# Patient Record
Sex: Female | Born: 2005 | Race: Black or African American | Hispanic: No | Marital: Single | Smoking: Never smoker
Health system: Southern US, Community
[De-identification: ages and names within clinical notes are randomized; demographics above are authoritative.]

## PROBLEM LIST (undated history)

## (undated) ENCOUNTER — Emergency Department (HOSPITAL_BASED_OUTPATIENT_CLINIC_OR_DEPARTMENT_OTHER): Admission: EM | Source: Home / Self Care

## (undated) DIAGNOSIS — J45909 Unspecified asthma, uncomplicated: Secondary | ICD-10-CM

---
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
## 2023-01-05 ENCOUNTER — Ambulatory Visit (INDEPENDENT_AMBULATORY_CARE_PROVIDER_SITE_OTHER): Payer: Medicaid Other

## 2023-01-05 DIAGNOSIS — J309 Allergic rhinitis, unspecified: Secondary | ICD-10-CM

## 2023-01-07 ENCOUNTER — Ambulatory Visit (INDEPENDENT_AMBULATORY_CARE_PROVIDER_SITE_OTHER): Payer: Medicaid Other

## 2023-01-07 DIAGNOSIS — J309 Allergic rhinitis, unspecified: Secondary | ICD-10-CM | POA: Diagnosis not present

## 2023-01-12 ENCOUNTER — Ambulatory Visit (INDEPENDENT_AMBULATORY_CARE_PROVIDER_SITE_OTHER): Payer: Medicaid Other

## 2023-01-12 DIAGNOSIS — J309 Allergic rhinitis, unspecified: Secondary | ICD-10-CM

## 2023-01-14 ENCOUNTER — Ambulatory Visit (INDEPENDENT_AMBULATORY_CARE_PROVIDER_SITE_OTHER): Payer: Medicaid Other

## 2023-01-14 DIAGNOSIS — J309 Allergic rhinitis, unspecified: Secondary | ICD-10-CM

## 2023-01-17 ENCOUNTER — Ambulatory Visit (INDEPENDENT_AMBULATORY_CARE_PROVIDER_SITE_OTHER): Payer: Medicaid Other

## 2023-01-17 DIAGNOSIS — J4550 Severe persistent asthma, uncomplicated: Secondary | ICD-10-CM

## 2023-01-18 NOTE — Patient Instructions (Signed)
Asthma Continue montelukast 10 mg once a day to prevent cough or wheeze Continue Breztri 2 puffs twice a day with a spacer to prevent cough or wheeze.  Continue albuterol 2 puffs every 4 hours as needed for cough or wheeze OR Instead use albuterol 0.083% solution via nebulizer one unit vial every 4 hours as needed for cough or wheeze You may use albuterol 5 to 15 minutes before activity to decrease cough or wheeze For asthma flare, add Flovent 110 2 puffs twice a day for 2 weeks or until cough and wheeze free Continue Nucala once every 4 weeks  Allergic rhinitis Continue allergen avoidance measures directed toward grass pollen, weed pollen, tree pollen, mold, dust mite, and cat as listed below Continue cetirizine 10 mg once a day as needed for runny nose or itch Continue Flonase 2 sprays in each nostril once a day as needed for stuffy nose.  In the right nostril, point the applicator out toward the right ear. In the left nostril, point the applicator out toward the left ear Consider allergy injections if the treatment plan as listed above is not managing your symptoms of allergic rhinitis  Atopic dermatitis Continue a twice a day moisturizing routine  Call the clinic if this treatment plan is not working well for you.  Follow up in the clinic in 6 months or sooner if needed  Reducing Pollen Exposure The American Academy of Allergy, Asthma and Immunology suggests the following steps to reduce your exposure to pollen during allergy seasons. Do not hang sheets or clothing out to dry; pollen may collect on these items. Do not mow lawns or spend time around freshly cut grass; mowing stirs up pollen. Keep windows closed at night.  Keep car windows closed while driving. Minimize morning activities outdoors, a time when pollen counts are usually at their highest. Stay indoors as much as possible when pollen counts or humidity is high and on windy days when pollen tends to remain in the air  longer. Use air conditioning when possible.  Many air conditioners have filters that trap the pollen spores. Use a HEPA room air filter to remove pollen form the indoor air you breathe.  Control of Mold Allergen Mold and fungi can grow on a variety of surfaces provided certain temperature and moisture conditions exist.  Outdoor molds grow on plants, decaying vegetation and soil.  The major outdoor mold, Alternaria and Cladosporium, are found in very high numbers during hot and dry conditions.  Generally, a late Summer - Fall peak is seen for common outdoor fungal spores.  Rain will temporarily lower outdoor mold spore count, but counts rise rapidly when the rainy period ends.  The most important indoor molds are Aspergillus and Penicillium.  Dark, humid and poorly ventilated basements are ideal sites for mold growth.  The next most common sites of mold growth are the bathroom and the kitchen.  Outdoor [employer] Use air conditioning and keep windows closed Avoid exposure to decaying vegetation. Avoid leaf raking. Avoid grain handling. Consider wearing a face mask if working in moldy areas.  Indoor Mold Control Maintain humidity below 50%. Clean washable surfaces with 5% bleach solution. Remove sources e.g. Contaminated carpets.   Control of Dust Mite Allergen Dust mites play a major role in allergic asthma and rhinitis. They occur in environments with high humidity wherever human skin is found. Dust mites absorb humidity from the atmosphere (ie, they do not drink) and feed on organic matter (including shed human and animal  skin). Dust mites are a microscopic type of insect that you cannot see with the naked eye. High levels of dust mites have been detected from mattresses, pillows, carpets, upholstered furniture, bed covers, clothes, soft toys and any woven material. The principal allergen of the dust mite is found in its feces. A gram of dust may contain 1,000 mites and 250,000 fecal  particles. Mite antigen is easily measured in the air during house cleaning activities. Dust mites do not bite and do not cause harm to humans, other than by triggering allergies/asthma.  Ways to decrease your exposure to dust mites in your home:  1. Encase mattresses, box springs and pillows with a mite-impermeable barrier or cover  2. Wash sheets, blankets and drapes weekly in hot water (130 F) with detergent and dry them in a dryer on the hot setting.  3. Have the room cleaned frequently with a vacuum cleaner and a damp dust-mop. For carpeting or rugs, vacuuming with a vacuum cleaner equipped with a high-efficiency particulate air (HEPA) filter. The dust mite allergic individual should not be in a room which is being cleaned and should wait 1 hour after cleaning before going into the room.  4. Do not sleep on upholstered furniture (eg, couches).  5. If possible removing carpeting, upholstered furniture and drapery from the home is ideal. Horizontal blinds should be eliminated in the rooms where the person spends the most time (bedroom, study, television room). Washable vinyl, roller-type shades are optimal.  6. Remove all non-washable stuffed toys from the bedroom. Wash stuffed toys weekly like sheets and blankets above.  7. Reduce indoor humidity to less than 50%. Inexpensive humidity monitors can be purchased at most hardware stores. Do not use a humidifier as can make the problem worse and are not recommended.  Control of Dog or Cat Allergen Avoidance is the best way to manage a dog or cat allergy. If you have a dog or cat and are allergic to dog or cats, consider removing the dog or cat from the home. If you have a dog or cat but don't want to find it a new home, or if your family wants a pet even though someone in the household is allergic, here are some strategies that may help keep symptoms at bay:  Keep the pet out of your bedroom and restrict it to only a few rooms. Be advised that  keeping the dog or cat in only one room will not limit the allergens to that room. Don't pet, hug or kiss the dog or cat; if you do, wash your hands with soap and water. High-efficiency particulate air (HEPA) cleaners run continuously in a bedroom or living room can reduce allergen levels over time. Regular use of a high-efficiency vacuum cleaner or a central vacuum can reduce allergen levels. Giving your dog or cat a bath at least once a week can reduce airborne allergen.

## 2023-01-18 NOTE — Progress Notes (Signed)
[address] [place] [address] Dept: [phone]  FOLLOW UP NOTE  Patient ID: Diana Arias, female    DOB: 14-Dec-2005  Age: 17 y.o. MRN: [id number] Date of Office Visit: 01/19/2023  Assessment  Chief Complaint: Follow-up  HPI Diana Arias is a 17 year old female who presents to the clinic for follow-up visit.  She was last seen in this clinic on 10/22/2022 by Dr. Dellis Anes for evaluation of asthma and allergic rhinitis on allergen immunotherapy.  She began allergen immunotherapy directed toward grass pollen, tree pollen, cat, dog, mold, dust mite, and 11/19/2022.  She completed rapid desensitization on 08/09/2022.  At today's visit, she reports her asthma has been moderately well-controlled but occasional shortness of breath.  She is out of her asthma medications at this time, however, she has previously taken montelukast 10 mg once a day, Breztri 2 puffs twice a day, and uses albuterol about 1-2 times a month with relief of symptoms.  She continues Nucala injections once a month with no large or local reactions.  She reports a significant decrease in her symptoms of asthma while continuing on Nucala injections.  Allergic rhinitis is reported as well-controlled with no symptoms including rhinorrhea, nasal congestion, sneezing, and postnasal drainage.  She continues Flonase daily and is not currently taking an antihistamine or using saline nasal rinses.  She continues allergen immunotherapy directed toward grass pollen, tree pollen, cat, dog, mold, and dust mite.  She denies large or local reactions.  She reports a significant decrease in her symptoms of allergic rhinitis while continuing allergen immunotherapy.  Atopic dermatitis is reported as well-controlled with no red or itchy areas.  She continues a twice a day moisturizing routine and has not needed any topical medicated treatments since her last visit to this clinic.  Her current medications are listed in the  chart.  Drug Allergies:  Allergies  Allergen Reactions   Cat Hair Extract     Physical Exam: BP 104/70   Pulse (!) 115   Temp 98 F (36.7 C)   Resp 14   Ht 5' 1.42" (1.56 m)   Wt 138 lb 4 oz (62.7 kg)   SpO2 98%   BMI 25.77 kg/m    Physical Exam Vitals reviewed.  Constitutional:      Appearance: Normal appearance.  HENT:     Head: Normocephalic and atraumatic.     Right Ear: Tympanic membrane normal.     Left Ear: Tympanic membrane normal.     Nose:     Comments: Bilateral nares edematous and pale with thin clear nasal drainage noted.  Pharynx normal.  Ears normal.  Eyes normal.    Mouth/Throat:     Pharynx: Oropharynx is clear.  Eyes:     Conjunctiva/sclera: Conjunctivae normal.  Cardiovascular:     Rate and Rhythm: Normal rate and regular rhythm.     Heart sounds: Normal heart sounds. No murmur heard. Pulmonary:     Effort: Pulmonary effort is normal.     Breath sounds: Normal breath sounds.     Comments: Lungs clear to auscultation Musculoskeletal:        General: Normal range of motion.     Cervical back: Normal range of motion and neck supple.  Skin:    General: Skin is warm and dry.  Neurological:     Mental Status: She is alert and oriented to person, place, and time.  Psychiatric:        Mood and Affect: Mood normal.  Behavior: Behavior normal.        Thought Content: Thought content normal.        Judgment: Judgment normal.     Diagnostics: FVC 2.42 which is 81% of predicted value, FEV1 1.50 which is 57% of predicted value.  Spirometry indicates moderate airway obstruction.  Assessment and Plan: 1. Severe persistent asthma without complication   2. Seasonal and perennial allergic rhinitis   3. Flexural atopic dermatitis     Meds ordered this encounter  Medications   BREZTRI AEROSPHERE 160-9-4.8 MCG/ACT AERO    Sig: Inhale 2 puffs into the lungs in the morning and at bedtime.    Dispense:  10.7 g    Refill:  5   cetirizine (ZYRTEC)  10 MG tablet    Sig: Take 1 tablet (10 mg total) by mouth daily.    Dispense:  30 tablet    Refill:  5   fluticasone (FLONASE) 50 MCG/ACT nasal spray    Sig: Place 2 sprays into both nostrils daily.    Dispense:  16 g    Refill:  5   montelukast (SINGULAIR) 10 MG tablet    Sig: Take 1 tablet (10 mg total) by mouth at bedtime.    Dispense:  30 tablet    Refill:  5    Patient Instructions  Asthma Continue montelukast 10 mg once a day to prevent cough or wheeze Continue Breztri 2 puffs twice a day with a spacer to prevent cough or wheeze.  Continue albuterol 2 puffs every 4 hours as needed for cough or wheeze OR Instead use albuterol 0.083% solution via nebulizer one unit vial every 4 hours as needed for cough or wheeze You may use albuterol 5 to 15 minutes before activity to decrease cough or wheeze For asthma flare, add Flovent 110 2 puffs twice a day for 2 weeks or until cough and wheeze free Continue Nucala once every 4 weeks  Allergic rhinitis Continue allergen avoidance measures directed toward grass pollen, weed pollen, tree pollen, mold, dust mite, and cat as listed below Continue cetirizine 10 mg once a day as needed for runny nose or itch Continue Flonase 2 sprays in each nostril once a day as needed for stuffy nose.  In the right nostril, point the applicator out toward the right ear. In the left nostril, point the applicator out toward the left ear Consider allergy injections if the treatment plan as listed above is not managing your symptoms of allergic rhinitis  Atopic dermatitis Continue a twice a day moisturizing routine  Call the clinic if this treatment plan is not working well for you.  Follow up in the clinic in 6 months or sooner if needed  Return in about 6 months (around 07/19/2023), or if symptoms worsen or fail to improve.    Thank you for the opportunity to care for this patient.  Please do not hesitate to contact me with questions.  Thermon Leyland,  FNP [facility]

## 2023-01-19 ENCOUNTER — Encounter: Payer: Self-pay | Admitting: Family Medicine

## 2023-01-19 ENCOUNTER — Ambulatory Visit: Payer: Medicaid Other | Admitting: Family Medicine

## 2023-01-19 ENCOUNTER — Other Ambulatory Visit: Payer: Self-pay

## 2023-01-19 VITALS — BP 104/70 | HR 115 | Temp 98.0°F | Resp 14 | Ht 61.42 in | Wt 138.2 lb

## 2023-01-19 DIAGNOSIS — J3089 Other allergic rhinitis: Secondary | ICD-10-CM | POA: Diagnosis not present

## 2023-01-19 DIAGNOSIS — L2089 Other atopic dermatitis: Secondary | ICD-10-CM | POA: Diagnosis not present

## 2023-01-19 DIAGNOSIS — J302 Other seasonal allergic rhinitis: Secondary | ICD-10-CM

## 2023-01-19 DIAGNOSIS — J4550 Severe persistent asthma, uncomplicated: Secondary | ICD-10-CM | POA: Diagnosis not present

## 2023-01-19 MED ORDER — FLUTICASONE PROPIONATE 50 MCG/ACT NA SUSP: 2.0000 | Freq: Every day | NASAL | 5 refills | Status: AC

## 2023-01-19 MED ORDER — CETIRIZINE HCL 10 MG PO TABS: 10.0000 mg | ORAL_TABLET | Freq: Every day | ORAL | 5 refills | Status: AC

## 2023-01-19 MED ORDER — BREZTRI AEROSPHERE 160-9-4.8 MCG/ACT IN AERO: 2.0000 | INHALATION_SPRAY | Freq: Two times a day (BID) | RESPIRATORY_TRACT | 5 refills | Status: DC

## 2023-01-19 MED ORDER — MONTELUKAST SODIUM 10 MG PO TABS: 10.0000 mg | ORAL_TABLET | Freq: Every day | ORAL | 5 refills | Status: AC

## 2023-01-26 ENCOUNTER — Ambulatory Visit (INDEPENDENT_AMBULATORY_CARE_PROVIDER_SITE_OTHER): Payer: Medicaid Other

## 2023-01-26 DIAGNOSIS — J309 Allergic rhinitis, unspecified: Secondary | ICD-10-CM | POA: Diagnosis not present

## 2023-01-28 ENCOUNTER — Ambulatory Visit (INDEPENDENT_AMBULATORY_CARE_PROVIDER_SITE_OTHER): Payer: Medicaid Other

## 2023-01-28 DIAGNOSIS — J309 Allergic rhinitis, unspecified: Secondary | ICD-10-CM

## 2023-02-02 ENCOUNTER — Ambulatory Visit (INDEPENDENT_AMBULATORY_CARE_PROVIDER_SITE_OTHER): Payer: Medicaid Other

## 2023-02-02 DIAGNOSIS — J309 Allergic rhinitis, unspecified: Secondary | ICD-10-CM | POA: Diagnosis not present

## 2023-02-04 ENCOUNTER — Ambulatory Visit (INDEPENDENT_AMBULATORY_CARE_PROVIDER_SITE_OTHER): Payer: Medicaid Other

## 2023-02-04 DIAGNOSIS — J309 Allergic rhinitis, unspecified: Secondary | ICD-10-CM | POA: Diagnosis not present

## 2023-02-14 ENCOUNTER — Ambulatory Visit (INDEPENDENT_AMBULATORY_CARE_PROVIDER_SITE_OTHER): Payer: Medicaid Other

## 2023-02-14 DIAGNOSIS — J4550 Severe persistent asthma, uncomplicated: Secondary | ICD-10-CM

## 2023-02-14 DIAGNOSIS — J309 Allergic rhinitis, unspecified: Secondary | ICD-10-CM

## 2023-02-25 ENCOUNTER — Ambulatory Visit (INDEPENDENT_AMBULATORY_CARE_PROVIDER_SITE_OTHER): Payer: Medicaid Other

## 2023-02-25 DIAGNOSIS — J309 Allergic rhinitis, unspecified: Secondary | ICD-10-CM | POA: Diagnosis not present

## 2023-03-02 ENCOUNTER — Ambulatory Visit: Payer: Medicaid Other

## 2023-03-02 DIAGNOSIS — J309 Allergic rhinitis, unspecified: Secondary | ICD-10-CM

## 2023-03-03 ENCOUNTER — Telehealth: Payer: Self-pay

## 2023-03-03 ENCOUNTER — Other Ambulatory Visit: Payer: Self-pay

## 2023-03-03 NOTE — Telephone Encounter (Signed)
 Please clarify if patient is going to continue on Nucala . Pharmacy had tried to contact patient over 3 times in Oct 24' with no response and the last time the pharmacy sent the medication for administration was in Sep 24'. It seems the office may be utilizing samples for patient therapy.

## 2023-03-03 NOTE — Telephone Encounter (Signed)
 Patient received last Nucala injection on 02/14/23 - next injection date: 03/14/23.  Called patient - DOB/DPR verified LMOVM that she needs to contact [facility] regarding the below notation.  Forwarding updated message to PA Team.

## 2023-03-04 ENCOUNTER — Ambulatory Visit: Payer: Medicaid Other

## 2023-03-09 ENCOUNTER — Ambulatory Visit (INDEPENDENT_AMBULATORY_CARE_PROVIDER_SITE_OTHER): Payer: Self-pay

## 2023-03-09 ENCOUNTER — Ambulatory Visit: Payer: Medicaid Other

## 2023-03-09 DIAGNOSIS — J309 Allergic rhinitis, unspecified: Secondary | ICD-10-CM

## 2023-03-11 ENCOUNTER — Ambulatory Visit (INDEPENDENT_AMBULATORY_CARE_PROVIDER_SITE_OTHER): Payer: Medicaid Other

## 2023-03-11 ENCOUNTER — Ambulatory Visit: Payer: Medicaid Other

## 2023-03-11 DIAGNOSIS — J309 Allergic rhinitis, unspecified: Secondary | ICD-10-CM | POA: Diagnosis not present

## 2023-03-14 ENCOUNTER — Ambulatory Visit: Payer: Medicaid Other

## 2023-03-14 DIAGNOSIS — J4550 Severe persistent asthma, uncomplicated: Secondary | ICD-10-CM

## 2023-03-14 DIAGNOSIS — J309 Allergic rhinitis, unspecified: Secondary | ICD-10-CM

## 2023-03-15 DIAGNOSIS — Z30017 Encounter for initial prescription of implantable subdermal contraceptive: Secondary | ICD-10-CM | POA: Diagnosis not present

## 2023-03-15 DIAGNOSIS — Z3202 Encounter for pregnancy test, result negative: Secondary | ICD-10-CM | POA: Diagnosis not present

## 2023-03-25 ENCOUNTER — Ambulatory Visit (INDEPENDENT_AMBULATORY_CARE_PROVIDER_SITE_OTHER): Payer: Medicaid Other

## 2023-03-25 DIAGNOSIS — J309 Allergic rhinitis, unspecified: Secondary | ICD-10-CM

## 2023-03-31 ENCOUNTER — Other Ambulatory Visit (HOSPITAL_COMMUNITY): Payer: Self-pay

## 2023-03-31 ENCOUNTER — Other Ambulatory Visit: Payer: Self-pay

## 2023-03-31 NOTE — Progress Notes (Signed)
 Specialty Pharmacy Refill Coordination Note  Diana Arias is a 18 y.o. female contacted today regarding refills of specialty medication(s) Mepolizumab  (Nucala )   Patient requested Courier to Provider Office   Delivery date: 04/04/23   Verified address: [facility], [address] [place], [place], [address]   Medication will be filled on 04/01/23.

## 2023-04-01 ENCOUNTER — Ambulatory Visit: Payer: Medicaid Other

## 2023-04-08 ENCOUNTER — Ambulatory Visit: Payer: Medicaid Other | Admitting: *Deleted

## 2023-04-08 DIAGNOSIS — J309 Allergic rhinitis, unspecified: Secondary | ICD-10-CM

## 2023-04-11 ENCOUNTER — Ambulatory Visit: Payer: Medicaid Other

## 2023-04-11 DIAGNOSIS — J309 Allergic rhinitis, unspecified: Secondary | ICD-10-CM | POA: Diagnosis not present

## 2023-04-11 DIAGNOSIS — J4550 Severe persistent asthma, uncomplicated: Secondary | ICD-10-CM | POA: Diagnosis not present

## 2023-04-13 ENCOUNTER — Other Ambulatory Visit: Payer: Self-pay

## 2023-04-20 ENCOUNTER — Ambulatory Visit (INDEPENDENT_AMBULATORY_CARE_PROVIDER_SITE_OTHER): Payer: Medicaid Other

## 2023-04-20 DIAGNOSIS — J309 Allergic rhinitis, unspecified: Secondary | ICD-10-CM | POA: Diagnosis not present

## 2023-04-27 ENCOUNTER — Other Ambulatory Visit: Payer: Self-pay

## 2023-04-27 NOTE — Progress Notes (Signed)
 Specialty Pharmacy Refill Coordination Note  Diana Arias is a 18 y.o. female contacted today regarding refills of specialty medication(s) Mepolizumab [name])   Patient requested Courier to Provider Office   Delivery date: 05/02/23   Verified address: [facility], [address] [place], [place], [address]   Medication will be filled on 04/29/23.

## 2023-04-27 NOTE — Progress Notes (Signed)
 Specialty Pharmacy Ongoing Clinical Assessment Note  Diana Arias is a 18 y.o. female who is being followed by the specialty pharmacy service for RxSp Asthma/COPD   Patient's specialty medication(s) reviewed today: Mepolizumab (Nucala)   Missed doses in the last 4 weeks: 0   Patient/Caregiver did not have any additional questions or concerns.   Therapeutic benefit summary: Patient is achieving benefit   Adverse events/side effects summary: No adverse events/side effects   Patient's therapy is appropriate to: Continue    Goals Addressed             This Visit's Progress    Reduce disease symptoms including coughing and shortness of breath       Patient is on track. Patient will maintain adherence         Follow up:  6 months  Otto Herb Specialty Pharmacist

## 2023-04-28 ENCOUNTER — Other Ambulatory Visit: Payer: Self-pay

## 2023-04-29 ENCOUNTER — Other Ambulatory Visit: Payer: Self-pay

## 2023-04-29 ENCOUNTER — Ambulatory Visit: Payer: Medicaid Other

## 2023-04-29 DIAGNOSIS — J309 Allergic rhinitis, unspecified: Secondary | ICD-10-CM | POA: Diagnosis not present

## 2023-05-06 ENCOUNTER — Ambulatory Visit: Payer: Medicaid Other

## 2023-05-09 ENCOUNTER — Ambulatory Visit: Payer: Medicaid Other

## 2023-05-09 DIAGNOSIS — J309 Allergic rhinitis, unspecified: Secondary | ICD-10-CM | POA: Diagnosis not present

## 2023-05-09 DIAGNOSIS — J4550 Severe persistent asthma, uncomplicated: Secondary | ICD-10-CM | POA: Diagnosis not present

## 2023-05-20 ENCOUNTER — Ambulatory Visit (INDEPENDENT_AMBULATORY_CARE_PROVIDER_SITE_OTHER): Payer: Self-pay

## 2023-05-20 ENCOUNTER — Ambulatory Visit

## 2023-05-20 DIAGNOSIS — J309 Allergic rhinitis, unspecified: Secondary | ICD-10-CM | POA: Diagnosis not present

## 2023-05-23 DIAGNOSIS — J3081 Allergic rhinitis due to animal (cat) (dog) hair and dander: Secondary | ICD-10-CM | POA: Diagnosis not present

## 2023-05-23 NOTE — Progress Notes (Signed)
 VIALS MADE 05-23-23

## 2023-05-24 DIAGNOSIS — J3089 Other allergic rhinitis: Secondary | ICD-10-CM | POA: Diagnosis not present

## 2023-05-27 ENCOUNTER — Other Ambulatory Visit: Payer: Self-pay

## 2023-05-27 ENCOUNTER — Ambulatory Visit (INDEPENDENT_AMBULATORY_CARE_PROVIDER_SITE_OTHER)

## 2023-05-27 ENCOUNTER — Other Ambulatory Visit (HOSPITAL_COMMUNITY): Payer: Self-pay

## 2023-05-27 DIAGNOSIS — J309 Allergic rhinitis, unspecified: Secondary | ICD-10-CM | POA: Diagnosis not present

## 2023-05-27 NOTE — Progress Notes (Signed)
 Specialty Pharmacy Refill Coordination Note  Diana Arias is a 18 y.o. female contacted today regarding refills of specialty medication(s) Mepolizumab [name])   Patient requested Courier to Provider Office   Delivery date: 05/31/23   Verified address: [facility] [address] [place] [address]   Medication will be filled on 05/30/23.

## 2023-05-30 ENCOUNTER — Other Ambulatory Visit: Payer: Self-pay

## 2023-06-03 ENCOUNTER — Ambulatory Visit (INDEPENDENT_AMBULATORY_CARE_PROVIDER_SITE_OTHER)

## 2023-06-03 DIAGNOSIS — J309 Allergic rhinitis, unspecified: Secondary | ICD-10-CM | POA: Diagnosis not present

## 2023-06-03 NOTE — Progress Notes (Signed)
 [name] Kitchen

## 2023-06-06 ENCOUNTER — Other Ambulatory Visit: Payer: Self-pay

## 2023-06-06 ENCOUNTER — Ambulatory Visit

## 2023-06-06 DIAGNOSIS — J4550 Severe persistent asthma, uncomplicated: Secondary | ICD-10-CM

## 2023-06-17 ENCOUNTER — Ambulatory Visit

## 2023-06-17 DIAGNOSIS — J309 Allergic rhinitis, unspecified: Secondary | ICD-10-CM | POA: Diagnosis not present

## 2023-06-21 ENCOUNTER — Other Ambulatory Visit: Payer: Self-pay

## 2023-06-24 ENCOUNTER — Ambulatory Visit (INDEPENDENT_AMBULATORY_CARE_PROVIDER_SITE_OTHER)

## 2023-06-24 DIAGNOSIS — J309 Allergic rhinitis, unspecified: Secondary | ICD-10-CM | POA: Diagnosis not present

## 2023-06-27 ENCOUNTER — Other Ambulatory Visit: Payer: Self-pay

## 2023-06-27 ENCOUNTER — Other Ambulatory Visit: Payer: Self-pay | Admitting: Allergy & Immunology

## 2023-06-27 NOTE — Progress Notes (Signed)
 Specialty Pharmacy Refill Coordination Note  Diana Arias is a 18 y.o. female contacted today regarding refills of specialty medication(s) Mepolizumab  (Nucala )   Patient requested Courier to Provider Office   Delivery date: 06/30/23   Verified address: [facility] [address]   Medication will be filled on 06/29/23.

## 2023-06-28 ENCOUNTER — Other Ambulatory Visit (HOSPITAL_COMMUNITY): Payer: Self-pay

## 2023-06-28 MED ORDER — NUCALA 100 MG ~~LOC~~ SOLR
SUBCUTANEOUS | 11 refills | Status: AC
  Filled 2023-06-28 (×2): qty 1, 28d supply, fill #0
  Filled 2023-08-15: qty 1, 28d supply, fill #1

## 2023-06-29 ENCOUNTER — Other Ambulatory Visit: Payer: Self-pay

## 2023-07-01 ENCOUNTER — Ambulatory Visit (INDEPENDENT_AMBULATORY_CARE_PROVIDER_SITE_OTHER)

## 2023-07-01 ENCOUNTER — Other Ambulatory Visit: Payer: Self-pay

## 2023-07-01 DIAGNOSIS — J309 Allergic rhinitis, unspecified: Secondary | ICD-10-CM

## 2023-07-01 NOTE — Progress Notes (Signed)
 Nucala  did not make it into courier today. Appt is 2pm 5/12 in [place], confirmed with Tammy. Tammy aware we will courier directly to [place] office this time only and not to [facility] so it is there in time for her appointment. Informed Tara as well for Monday.

## 2023-07-04 ENCOUNTER — Ambulatory Visit

## 2023-07-19 NOTE — Patient Instructions (Incomplete)
 Asthma Continue montelukast  10 mg once a day to prevent cough or wheeze Continue Breztri  2 puffs twice a day with a spacer to prevent cough or wheeze.  Continue albuterol  2 puffs every 4 hours as needed for cough or wheeze OR Instead use albuterol  0.083% solution via nebulizer one unit vial every 4 hours as needed for cough or wheeze You may use albuterol  5 to 15 minutes before activity to decrease cough or wheeze For now and for asthma flare, add Flovent  110 2 puffs twice a day for 2 weeks or until cough and wheeze free Continue Nucala  once every 4 weeks  Allergic rhinitis Continue allergen avoidance measures directed toward grass pollen, weed pollen, tree pollen, mold, dust mite, and cat as listed below Continue allergen immunotherapy and have access to an epinephrine  autoinjector set per protocol Continue cetirizine  10 mg once a day as needed for runny nose or itch Continue Flonase  2 sprays in each nostril once a day as needed for stuffy nose.  In the right nostril, point the applicator out toward the right ear. In the left nostril, point the applicator out toward the left ear Consider saline nasal rinses as needed for nasal symptoms. Use this before any medicated nasal sprays for best result   Atopic dermatitis Continue a twice a day moisturizing routine  Call the clinic if this treatment plan is not working well for you.  Follow up in the clinic in 2 months or sooner if needed  Reducing Pollen Exposure The American Academy of Allergy, Asthma and Immunology suggests the following steps to reduce your exposure to pollen during allergy seasons. Do not hang sheets or clothing out to dry; pollen may collect on these items. Do not mow lawns or spend time around freshly cut grass; mowing stirs up pollen. Keep windows closed at night.  Keep car windows closed while driving. Minimize morning activities outdoors, a time when pollen counts are usually at their highest. Stay indoors as much as  possible when pollen counts or humidity is high and on windy days when pollen tends to remain in the air longer. Use air conditioning when possible.  Many air conditioners have filters that trap the pollen spores. Use a HEPA room air filter to remove pollen form the indoor air you breathe.  Control of Mold Allergen Mold and fungi can grow on a variety of surfaces provided certain temperature and moisture conditions exist.  Outdoor molds grow on plants, decaying vegetation and soil.  The major outdoor mold, Alternaria and Cladosporium, are found in very high numbers during hot and dry conditions.  Generally, a late Summer - Fall peak is seen for common outdoor fungal spores.  Rain will temporarily lower outdoor mold spore count, but counts rise rapidly when the rainy period ends.  The most important indoor molds are Aspergillus and Penicillium.  Dark, humid and poorly ventilated basements are ideal sites for mold growth.  The next most common sites of mold growth are the bathroom and the kitchen.  Outdoor [employer] Use air conditioning and keep windows closed Avoid exposure to decaying vegetation. Avoid leaf raking. Avoid grain handling. Consider wearing a face mask if working in moldy areas.  Indoor Mold Control Maintain humidity below 50%. Clean washable surfaces with 5% bleach solution. Remove sources e.g. Contaminated carpets.   Control of Dust Mite Allergen Dust mites play a major role in allergic asthma and rhinitis. They occur in environments with high humidity wherever human skin is found. Dust mites absorb humidity  from the atmosphere (ie, they do not drink) and feed on organic matter (including shed human and animal skin). Dust mites are a microscopic type of insect that you cannot see with the naked eye. High levels of dust mites have been detected from mattresses, pillows, carpets, upholstered furniture, bed covers, clothes, soft toys and any woven material. The principal allergen  of the dust mite is found in its feces. A gram of dust may contain 1,000 mites and 250,000 fecal particles. Mite antigen is easily measured in the air during house cleaning activities. Dust mites do not bite and do not cause harm to humans, other than by triggering allergies/asthma.  Ways to decrease your exposure to dust mites in your home:  1. Encase mattresses, box springs and pillows with a mite-impermeable barrier or cover  2. Wash sheets, blankets and drapes weekly in hot water (130 F) with detergent and dry them in a dryer on the hot setting.  3. Have the room cleaned frequently with a vacuum cleaner and a damp dust-mop. For carpeting or rugs, vacuuming with a vacuum cleaner equipped with a high-efficiency particulate air (HEPA) filter. The dust mite allergic individual should not be in a room which is being cleaned and should wait 1 hour after cleaning before going into the room.  4. Do not sleep on upholstered furniture (eg, couches).  5. If possible removing carpeting, upholstered furniture and drapery from the home is ideal. Horizontal blinds should be eliminated in the rooms where the person spends the most time (bedroom, study, television room). Washable vinyl, roller-type shades are optimal.  6. Remove all non-washable stuffed toys from the bedroom. Wash stuffed toys weekly like sheets and blankets above.  7. Reduce indoor humidity to less than 50%. Inexpensive humidity monitors can be purchased at most hardware stores. Do not use a humidifier as can make the problem worse and are not recommended.  Control of Dog or Cat Allergen Avoidance is the best way to manage a dog or cat allergy. If you have a dog or cat and are allergic to dog or cats, consider removing the dog or cat from the home. If you have a dog or cat but don't want to find it a new home, or if your family wants a pet even though someone in the household is allergic, here are some strategies that may help keep symptoms  at bay:  Keep the pet out of your bedroom and restrict it to only a few rooms. Be advised that keeping the dog or cat in only one room will not limit the allergens to that room. Don't pet, hug or kiss the dog or cat; if you do, wash your hands with soap and water. High-efficiency particulate air (HEPA) cleaners run continuously in a bedroom or living room can reduce allergen levels over time. Regular use of a high-efficiency vacuum cleaner or a central vacuum can reduce allergen levels. Giving your dog or cat a bath at least once a week can reduce airborne allergen.

## 2023-07-19 NOTE — Progress Notes (Signed)
 [address] [place] [address] Dept: [phone]  FOLLOW UP NOTE  Patient ID: Diana Arias, female    DOB: 07-03-2005  Age: 18 y.o. MRN: [id number] Date of Office Visit: 07/20/2023  Assessment  Chief Complaint: Follow-up (Cough- since last Saturday /) and Oral Pain   HPI Diana Arias is a 18 year old female who presents to the clinic for a follow-up visit.  She was last seen in this clinic on 01/19/2023 by Marinus Sic, FNP, for evaluation of asthma, allergic rhinitis on allergen immunotherapy, and atopic dermatitis.  At today's visit, she reports her asthma has been well-controlled with no symptoms into Friday when she began to experience cough which is mostly dry and occasionally producing some mucus ranging in color from clear to light yellow.  She denies recent fever, sweats, chills, or sick contacts.  Otherwise, she reports her asthma has been well-controlled.  She continues montelukast  10 mg once a day, Breztri  2 puffs twice a day with a spacer, and uses albuterol  1-2 times a week with relief of symptoms.  She continues Nucala  injections once a week with no large or local reactions.  She reports a significant decrease in her asthma symptoms while continuing on Nucala  injections.  Allergic rhinitis is reported as moderately well-controlled with occasional sneezing as the main symptom.  She continues cetirizine  10 mg once a day, Flonase  with excellent application technique, and is not currently using nasal saline rinses. She began allergen immunotherapy directed toward grass pollen, tree pollen, cat, dog, mold, dust mite, and 11/19/2022.  Atopic dermatitis is reported as well-controlled with no red or itchy eczema areas.  She continues a daily moisturizing routine and is not currently using any topical medicated treatments.  Her current medications are listed in the chart.  Drug Allergies:  Allergies  Allergen Reactions   Cat Dander     Physical Exam: BP  128/82   Pulse (!) 132   Temp 98.6 F (37 C)   Resp 18   Ht 5' 1.61" (1.565 m)   Wt 130 lb (59 kg)   SpO2 97%   BMI 24.08 kg/m    Physical Exam Vitals reviewed.  Constitutional:      Appearance: Normal appearance.  HENT:     Head: Normocephalic and atraumatic.     Right Ear: Tympanic membrane normal.     Left Ear: Tympanic membrane normal.     Nose:     Comments: Bilateral nares slightly erythematous with thin clear nasal drainage noted.  Pharynx normal.  Ears normal.  Eyes normal.    Mouth/Throat:     Pharynx: Oropharynx is clear.  Eyes:     Conjunctiva/sclera: Conjunctivae normal.  Cardiovascular:     Rate and Rhythm: Normal rate and regular rhythm.  Pulmonary:     Effort: Pulmonary effort is normal.     Breath sounds: Normal breath sounds.     Comments: Lungs clear to auscultation Musculoskeletal:        General: Normal range of motion.     Cervical back: Normal range of motion and neck supple.  Skin:    General: Skin is warm and dry.  Neurological:     Mental Status: She is alert and oriented to person, place, and time.  Psychiatric:        Mood and Affect: Mood normal.        Behavior: Behavior normal.        Thought Content: Thought content normal.  Judgment: Judgment normal.     Diagnostics: FVC 2.87 which is 98% of predicted value, FEV1 2.03 which is 78% of predicted value.  Postbronchodilator FVC 2.84, FEV1 2.18 which is a 7% increase in FEV1.  Assessment and Plan: 1. Severe persistent asthma with acute exacerbation   2. Seasonal and perennial allergic rhinitis   3. Flexural atopic dermatitis     No orders of the defined types were placed in this encounter.   Patient Instructions  Asthma Continue montelukast  10 mg once a day to prevent cough or wheeze Continue Breztri  2 puffs twice a day with a spacer to prevent cough or wheeze.  Continue albuterol  2 puffs every 4 hours as needed for cough or wheeze OR Instead use albuterol  0.083% solution  via nebulizer one unit vial every 4 hours as needed for cough or wheeze You may use albuterol  5 to 15 minutes before activity to decrease cough or wheeze For now and for asthma flare, add Flovent  110 -2 puffs twice a day for 2 weeks or until cough and wheeze free Continue Nucala  once every 4 weeks  Allergic rhinitis Continue allergen avoidance measures directed toward grass pollen, weed pollen, tree pollen, mold, dust mite, and cat as listed below Continue allergen immunotherapy and have access to an epinephrine  autoinjector set per protocol Continue cetirizine  10 mg once a day as needed for runny nose or itch Continue Flonase  2 sprays in each nostril once a day as needed for stuffy nose.  In the right nostril, point the applicator out toward the right ear. In the left nostril, point the applicator out toward the left ear Consider saline nasal rinses as needed for nasal symptoms. Use this before any medicated nasal sprays for best result   Atopic dermatitis Continue a twice a day moisturizing routine  Call the clinic if this treatment plan is not working well for you.  Follow up in the clinic in 2 months or sooner if needed   Return in about 2 months (around 09/19/2023), or if symptoms worsen or fail to improve.    Thank you for the opportunity to care for this patient.  Please do not hesitate to contact me with questions.  Marinus Sic, FNP [facility] 

## 2023-07-20 ENCOUNTER — Other Ambulatory Visit: Payer: Self-pay

## 2023-07-20 ENCOUNTER — Encounter: Payer: Self-pay | Admitting: Family Medicine

## 2023-07-20 ENCOUNTER — Ambulatory Visit (INDEPENDENT_AMBULATORY_CARE_PROVIDER_SITE_OTHER): Payer: Medicaid Other | Admitting: Family Medicine

## 2023-07-20 VITALS — BP 128/82 | HR 132 | Temp 98.6°F | Resp 18 | Ht 61.61 in | Wt 130.0 lb

## 2023-07-20 DIAGNOSIS — J4551 Severe persistent asthma with (acute) exacerbation: Secondary | ICD-10-CM | POA: Diagnosis not present

## 2023-07-20 DIAGNOSIS — J3089 Other allergic rhinitis: Secondary | ICD-10-CM | POA: Diagnosis not present

## 2023-07-20 DIAGNOSIS — J302 Other seasonal allergic rhinitis: Secondary | ICD-10-CM

## 2023-07-20 DIAGNOSIS — L2089 Other atopic dermatitis: Secondary | ICD-10-CM

## 2023-07-21 ENCOUNTER — Telehealth: Payer: Self-pay

## 2023-07-21 ENCOUNTER — Encounter: Payer: Self-pay | Admitting: Family Medicine

## 2023-07-21 NOTE — Telephone Encounter (Signed)
 Please clarify if/how many doses patient has in office for Nucala - thank you!

## 2023-07-22 ENCOUNTER — Other Ambulatory Visit: Payer: Self-pay | Admitting: Allergy & Immunology

## 2023-07-29 ENCOUNTER — Ambulatory Visit (INDEPENDENT_AMBULATORY_CARE_PROVIDER_SITE_OTHER)

## 2023-07-29 DIAGNOSIS — J309 Allergic rhinitis, unspecified: Secondary | ICD-10-CM

## 2023-07-29 DIAGNOSIS — J4551 Severe persistent asthma with (acute) exacerbation: Secondary | ICD-10-CM

## 2023-07-29 DIAGNOSIS — J4550 Severe persistent asthma, uncomplicated: Secondary | ICD-10-CM

## 2023-08-05 ENCOUNTER — Ambulatory Visit (INDEPENDENT_AMBULATORY_CARE_PROVIDER_SITE_OTHER)

## 2023-08-05 DIAGNOSIS — J309 Allergic rhinitis, unspecified: Secondary | ICD-10-CM | POA: Diagnosis not present

## 2023-08-12 ENCOUNTER — Ambulatory Visit

## 2023-08-12 ENCOUNTER — Other Ambulatory Visit: Payer: Self-pay

## 2023-08-15 ENCOUNTER — Other Ambulatory Visit: Payer: Self-pay

## 2023-08-15 ENCOUNTER — Encounter (INDEPENDENT_AMBULATORY_CARE_PROVIDER_SITE_OTHER): Payer: Self-pay

## 2023-08-15 NOTE — Progress Notes (Signed)
 Specialty Pharmacy Refill Coordination Note  Diana Arias is a 18 y.o. female contacted today regarding refills of specialty medication(s) Mepolizumab  (Nucala )   Patient requested Delivery   Delivery date: 08/17/23   Verified address: [address] [place], [place]   Medication will be filled on 08/16/23.   Based on previous fills and [facility] appointment scheduled for 08/19/23, this medication will be couriered to [facility] [place] [address]

## 2023-08-16 ENCOUNTER — Other Ambulatory Visit: Payer: Self-pay

## 2023-08-19 ENCOUNTER — Ambulatory Visit (INDEPENDENT_AMBULATORY_CARE_PROVIDER_SITE_OTHER)

## 2023-08-19 DIAGNOSIS — J309 Allergic rhinitis, unspecified: Secondary | ICD-10-CM

## 2023-08-29 ENCOUNTER — Ambulatory Visit

## 2023-09-02 ENCOUNTER — Other Ambulatory Visit: Payer: Self-pay

## 2023-09-02 NOTE — Progress Notes (Signed)
 Opened encounter in error

## 2023-09-09 ENCOUNTER — Ambulatory Visit

## 2023-09-09 DIAGNOSIS — J309 Allergic rhinitis, unspecified: Secondary | ICD-10-CM | POA: Diagnosis not present

## 2023-09-16 ENCOUNTER — Ambulatory Visit

## 2023-09-16 DIAGNOSIS — J309 Allergic rhinitis, unspecified: Secondary | ICD-10-CM

## 2023-10-06 ENCOUNTER — Other Ambulatory Visit: Payer: Self-pay | Admitting: Family Medicine

## 2023-10-17 ENCOUNTER — Other Ambulatory Visit: Payer: Self-pay

## 2023-12-27 ENCOUNTER — Other Ambulatory Visit: Payer: Self-pay | Admitting: Pharmacy Technician

## 2023-12-27 ENCOUNTER — Other Ambulatory Visit: Payer: Self-pay

## 2023-12-27 NOTE — Progress Notes (Signed)
 Disenrolled; Last filled 08/16/2023
